# Patient Record
Sex: Male | Born: 2000 | Race: Black or African American | Hispanic: No | Marital: Single | Smoking: Never smoker
Health system: Southern US, Community
[De-identification: ages and names within clinical notes are randomized; demographics above are authoritative.]

---
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
## 2019-10-20 ENCOUNTER — Ambulatory Visit: Payer: Self-pay | Attending: Critical Care Medicine

## 2019-10-20 DIAGNOSIS — Z23 Encounter for immunization: Secondary | ICD-10-CM

## 2019-10-20 NOTE — Progress Notes (Signed)
   Covid-19 Vaccination Clinic  Name:  William Jacobs    MRN: [id number] DOB: 02-Oct-2000  10/20/2019  [name] was observed post Covid-19 immunization for 15 minutes without incident. He was provided with Vaccine Information Sheet and instruction to access the V-Safe system.   [name] was instructed to call 911 with any severe reactions post vaccine: [name] Kitchen Difficulty breathing  . Swelling of face and throat  . A fast heartbeat  . A bad rash all over body  . Dizziness and weakness   Immunizations Administered    Name Date Dose VIS Date Route   Moderna COVID-19 Vaccine 10/20/2019  3:12 PM 0.5 mL 02/2019 Intramuscular   Manufacturer: Moderna   Lot: [id number]   NDC: 23300-762-26

## 2019-11-17 ENCOUNTER — Ambulatory Visit: Payer: Self-pay | Attending: Critical Care Medicine

## 2019-11-17 DIAGNOSIS — Z23 Encounter for immunization: Secondary | ICD-10-CM

## 2019-11-17 NOTE — Progress Notes (Signed)
   Covid-19 Vaccination Clinic  Name:  William Jacobs    MRN: [id number] DOB: [birth year]  11/17/2019  [name] was observed post Covid-19 immunization for 15 minutes without incident. He was provided with Vaccine Information Sheet and instruction to access the V-Safe system.   [name] was instructed to call 911 with any severe reactions post vaccine: [name] Kitchen Difficulty breathing  . Swelling of face and throat  . A fast heartbeat  . A bad rash all over body  . Dizziness and weakness   Immunizations Administered    Name Date Dose VIS Date Route   Moderna COVID-19 Vaccine 11/17/2019  9:24 AM 0.5 mL 02/2019 Intramuscular   Manufacturer: Moderna   Lot: [id number]   NDC: 14782-956-21

## 2020-07-29 ENCOUNTER — Encounter (HOSPITAL_BASED_OUTPATIENT_CLINIC_OR_DEPARTMENT_OTHER): Payer: Self-pay | Admitting: *Deleted

## 2020-07-29 ENCOUNTER — Emergency Department (HOSPITAL_BASED_OUTPATIENT_CLINIC_OR_DEPARTMENT_OTHER): Payer: BC Managed Care – PPO

## 2020-07-29 ENCOUNTER — Emergency Department (HOSPITAL_BASED_OUTPATIENT_CLINIC_OR_DEPARTMENT_OTHER)
Admission: EM | Admit: 2020-07-29 | Discharge: 2020-07-29 | Disposition: A | Discharge status: Home / Self Care | Payer: BC Managed Care – PPO | Attending: Emergency Medicine | Admitting: Emergency Medicine

## 2020-07-29 ENCOUNTER — Other Ambulatory Visit: Payer: Self-pay

## 2020-07-29 DIAGNOSIS — S39012A Strain of muscle, fascia and tendon of lower back, initial encounter: Secondary | ICD-10-CM | POA: Diagnosis not present

## 2020-07-29 DIAGNOSIS — Y92410 Unspecified street and highway as the place of occurrence of the external cause: Secondary | ICD-10-CM | POA: Diagnosis not present

## 2020-07-29 DIAGNOSIS — S161XXA Strain of muscle, fascia and tendon at neck level, initial encounter: Secondary | ICD-10-CM | POA: Diagnosis not present

## 2020-07-29 DIAGNOSIS — S199XXA Unspecified injury of neck, initial encounter: Secondary | ICD-10-CM | POA: Diagnosis present

## 2020-07-29 MED ORDER — IBUPROFEN 600 MG PO TABS
600.0000 mg | ORAL_TABLET | Freq: Four times a day (QID) | ORAL | 0 refills | Status: AC | PRN
Start: 2020-07-29 — End: ?

## 2020-07-29 MED ORDER — KETOROLAC TROMETHAMINE 30 MG/ML IJ SOLN
30.0000 mg | Freq: Once | INTRAMUSCULAR | Status: AC
  Administered 2020-07-29: 30 mg via INTRAMUSCULAR
  Filled 2020-07-29: qty 1

## 2020-07-29 MED ORDER — METHOCARBAMOL 500 MG PO TABS
500.0000 mg | ORAL_TABLET | Freq: Two times a day (BID) | ORAL | 0 refills | Status: AC
Start: 2020-07-29 — End: ?
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
